# Patient Record
Sex: Female | Born: 1995 | Race: White | Hispanic: Yes | Marital: Single | Smoking: Never smoker
Health system: Southern US, Community
[De-identification: ages and names within clinical notes are randomized; demographics above are authoritative.]

---

## 2019-11-21 ENCOUNTER — Emergency Department (HOSPITAL_BASED_OUTPATIENT_CLINIC_OR_DEPARTMENT_OTHER)
Admission: EM | Admit: 2019-11-21 | Discharge: 2019-11-21 | Disposition: A | Discharge status: Home / Self Care | Payer: Medicaid Other | Attending: Emergency Medicine | Admitting: Emergency Medicine

## 2019-11-21 ENCOUNTER — Emergency Department (HOSPITAL_BASED_OUTPATIENT_CLINIC_OR_DEPARTMENT_OTHER): Payer: Medicaid Other

## 2019-11-21 ENCOUNTER — Other Ambulatory Visit: Payer: Self-pay

## 2019-11-21 ENCOUNTER — Encounter (HOSPITAL_BASED_OUTPATIENT_CLINIC_OR_DEPARTMENT_OTHER): Payer: Self-pay | Admitting: Emergency Medicine

## 2019-11-21 DIAGNOSIS — J029 Acute pharyngitis, unspecified: Secondary | ICD-10-CM | POA: Insufficient documentation

## 2019-11-21 DIAGNOSIS — R509 Fever, unspecified: Secondary | ICD-10-CM | POA: Diagnosis present

## 2019-11-21 DIAGNOSIS — Z20822 Contact with and (suspected) exposure to covid-19: Secondary | ICD-10-CM | POA: Diagnosis not present

## 2019-11-21 DIAGNOSIS — J069 Acute upper respiratory infection, unspecified: Secondary | ICD-10-CM

## 2019-11-21 LAB — SARS CORONAVIRUS 2 (TAT 6-24 HRS): SARS Coronavirus 2: NEGATIVE

## 2019-11-21 MED ORDER — ALBUTEROL SULFATE HFA 108 (90 BASE) MCG/ACT IN AERS
2.0000 | INHALATION_SPRAY | RESPIRATORY_TRACT | 0 refills | Status: AC | PRN
Start: 2019-11-21 — End: ?

## 2019-11-21 MED ORDER — BENZONATATE 200 MG PO CAPS: 200.0000 mg | ORAL_CAPSULE | Freq: Three times a day (TID) | ORAL | 0 refills | Status: AC | PRN

## 2019-11-21 MED ORDER — IBUPROFEN 800 MG PO TABS
800.0000 mg | ORAL_TABLET | Freq: Four times a day (QID) | ORAL | 0 refills | Status: AC | PRN
Start: 2019-11-21 — End: ?

## 2019-11-21 MED ORDER — PREDNISONE 20 MG PO TABS
40.0000 mg | ORAL_TABLET | Freq: Every day | ORAL | 0 refills | Status: AC
Start: 2019-11-21 — End: ?

## 2019-11-21 NOTE — ED Triage Notes (Signed)
Pt reports cough, chills, shortness of breath, fever since Wednesday. TMAX 103 at home

## 2019-11-21 NOTE — ED Provider Notes (Signed)
[facility] EMERGENCY DEPARTMENT Provider Note   CSN: [id number] Arrival date & time: 11/21/19  2706     History No chief complaint on file.   Julie Parrish is a 24 y.o. female.  Patient reports that she has been sick for 3 days with fever, chills, malaise, cough, shortness of breath.  She has had some nausea, vomiting and diarrhea.  Highest temperature at home was 103.        History reviewed. No pertinent past medical history.  There are no problems to display for this patient.      OB History   No obstetric history on file.     History reviewed. No pertinent family history.  Social History   Tobacco Use  . Smoking status: Not on file  Substance Use Topics  . Alcohol use: Not on file  . Drug use: Not on file    Home Medications Prior to Admission medications   Not on File    Allergies    Bee venom  Review of Systems   Review of Systems  Constitutional: Positive for chills and fever.  Respiratory: Positive for cough and shortness of breath.   Gastrointestinal: Positive for diarrhea, nausea and vomiting.  All other systems reviewed and are negative.   Physical Exam Updated Vital Signs BP 120/77   Pulse (!) 103   Temp 99.1 F (37.3 C) (Oral)   Resp 18   Ht 5\' 7"  (1.702 m)   Wt 51.3 kg   LMP 11/14/2019 (Within Weeks)   SpO2 99%   BMI 17.70 kg/m   Physical Exam Vitals and nursing note reviewed.  Constitutional:      General: She is not in acute distress.    Appearance: Normal appearance. She is well-developed.  HENT:     Head: Normocephalic and atraumatic.     Right Ear: Hearing normal.     Left Ear: Hearing normal.     Nose: Nose normal.  Eyes:     Conjunctiva/sclera: Conjunctivae normal.     Pupils: Pupils are equal, round, and reactive to light.  Cardiovascular:     Rate and Rhythm: Regular rhythm.     Heart sounds: S1 normal and S2 normal. No murmur. No friction rub. No gallop.   Pulmonary:     Effort: Pulmonary  effort is normal. No respiratory distress.     Breath sounds: Normal breath sounds.  Chest:     Chest wall: No tenderness.  Abdominal:     General: Bowel sounds are normal.     Palpations: Abdomen is soft.     Tenderness: There is no abdominal tenderness. There is no guarding or rebound. Negative signs include Murphy's sign and McBurney's sign.     Hernia: No hernia is present.  Musculoskeletal:        General: Normal range of motion.     Cervical back: Normal range of motion and neck supple.  Skin:    General: Skin is warm and dry.     Findings: No rash.  Neurological:     Mental Status: She is alert and oriented to person, place, and time.     GCS: GCS eye subscore is 4. GCS verbal subscore is 5. GCS motor subscore is 6.     Cranial Nerves: No cranial nerve deficit.     Sensory: No sensory deficit.     Coordination: Coordination normal.  Psychiatric:        Speech: Speech normal.  Behavior: Behavior normal.        Thought Content: Thought content normal.     ED Results / Procedures / Treatments   Labs (all labs ordered are listed, but only abnormal results are displayed) Labs Reviewed  SARS CORONAVIRUS 2 (TAT 6-24 HRS)    EKG None  Radiology No results found.  Procedures Procedures (including critical care time)  Medications Ordered in ED Medications - No data to display  ED Course  I have reviewed the triage vital signs and the nursing notes.  Pertinent labs & imaging results that were available during my care of the patient were reviewed by me and considered in my medical decision making (see chart for details).    MDM Rules/Calculators/A&P                      Patient presents to the emergency department for evaluation of Covid symptoms.  She has been running a fever with URI and GI symptoms for the last 3 days.  She reports shortness of breath but has room air oxygen saturation of 99% with clear lung fields.  Chest x-ray is clear, no evidence of  pneumonia or pneumonitis.  Covid testing sent.  Patient appears well otherwise, will treat symptomatically.  Final Clinical Impression(s) / ED Diagnoses Final diagnoses:  Upper respiratory tract infection, unspecified type    Rx / DC Orders ED Discharge Orders    None       Saralee Bolick, Canary Brim, MD 11/21/19 0501

## 2019-12-17 ENCOUNTER — Inpatient Hospital Stay (HOSPITAL_COMMUNITY): Payer: Medicaid Other

## 2019-12-17 ENCOUNTER — Observation Stay (HOSPITAL_BASED_OUTPATIENT_CLINIC_OR_DEPARTMENT_OTHER)
Admission: AD | Admit: 2019-12-17 | Discharge: 2019-12-18 | Disposition: A | Discharge status: Home / Self Care | Payer: Medicaid Other | Attending: Obstetrics and Gynecology | Admitting: Obstetrics and Gynecology

## 2019-12-17 ENCOUNTER — Other Ambulatory Visit: Payer: Self-pay

## 2019-12-17 ENCOUNTER — Emergency Department (HOSPITAL_BASED_OUTPATIENT_CLINIC_OR_DEPARTMENT_OTHER): Payer: Medicaid Other

## 2019-12-17 ENCOUNTER — Encounter (HOSPITAL_BASED_OUTPATIENT_CLINIC_OR_DEPARTMENT_OTHER): Payer: Self-pay | Admitting: Emergency Medicine

## 2019-12-17 DIAGNOSIS — D6489 Other specified anemias: Secondary | ICD-10-CM | POA: Insufficient documentation

## 2019-12-17 DIAGNOSIS — Z20822 Contact with and (suspected) exposure to covid-19: Secondary | ICD-10-CM | POA: Diagnosis not present

## 2019-12-17 DIAGNOSIS — Z3A01 Less than 8 weeks gestation of pregnancy: Secondary | ICD-10-CM | POA: Insufficient documentation

## 2019-12-17 DIAGNOSIS — R102 Pelvic and perineal pain: Secondary | ICD-10-CM | POA: Diagnosis not present

## 2019-12-17 DIAGNOSIS — O034 Incomplete spontaneous abortion without complication: Principal | ICD-10-CM | POA: Insufficient documentation

## 2019-12-17 DIAGNOSIS — Z79899 Other long term (current) drug therapy: Secondary | ICD-10-CM | POA: Insufficient documentation

## 2019-12-17 DIAGNOSIS — O209 Hemorrhage in early pregnancy, unspecified: Secondary | ICD-10-CM | POA: Diagnosis present

## 2019-12-17 DIAGNOSIS — D649 Anemia, unspecified: Secondary | ICD-10-CM | POA: Diagnosis present

## 2019-12-17 DIAGNOSIS — O039 Complete or unspecified spontaneous abortion without complication: Secondary | ICD-10-CM

## 2019-12-17 DIAGNOSIS — O031 Delayed or excessive hemorrhage following incomplete spontaneous abortion: Secondary | ICD-10-CM | POA: Diagnosis not present

## 2019-12-17 LAB — CBC WITH DIFFERENTIAL/PLATELET
Abs Immature Granulocytes: 0.04 10*3/uL (ref 0.00–0.07)
Basophils Absolute: 0.1 10*3/uL (ref 0.0–0.1)
Basophils Relative: 1 %
Eosinophils Absolute: 0.1 10*3/uL (ref 0.0–0.5)
Eosinophils Relative: 1 %
HCT: 33.2 % — ABNORMAL LOW (ref 36.0–46.0)
Hemoglobin: 10.1 g/dL — ABNORMAL LOW (ref 12.0–15.0)
Immature Granulocytes: 0 %
Lymphocytes Relative: 24 %
Lymphs Abs: 2.5 10*3/uL (ref 0.7–4.0)
MCH: 24.4 pg — ABNORMAL LOW (ref 26.0–34.0)
MCHC: 30.4 g/dL (ref 30.0–36.0)
MCV: 80.2 fL (ref 80.0–100.0)
Monocytes Absolute: 0.8 10*3/uL (ref 0.1–1.0)
Monocytes Relative: 8 %
Neutro Abs: 7 10*3/uL (ref 1.7–7.7)
Neutrophils Relative %: 66 %
Platelets: 274 10*3/uL (ref 150–400)
RBC: 4.14 MIL/uL (ref 3.87–5.11)
RDW: 14.3 % (ref 11.5–15.5)
WBC: 10.5 10*3/uL (ref 4.0–10.5)
nRBC: 0 % (ref 0.0–0.2)

## 2019-12-17 LAB — BASIC METABOLIC PANEL
Anion gap: 10 (ref 5–15)
BUN: 8 mg/dL (ref 6–20)
CO2: 24 mmol/L (ref 22–32)
Calcium: 9.1 mg/dL (ref 8.9–10.3)
Chloride: 101 mmol/L (ref 98–111)
Creatinine, Ser: 0.62 mg/dL (ref 0.44–1.00)
GFR calc Af Amer: >60 mL/min (ref >60)
GFR calc non Af Amer: >60 mL/min (ref >60)
Glucose, Bld: 102 mg/dL — ABNORMAL HIGH (ref 70–99)
Potassium: 3.5 mmol/L (ref 3.5–5.1)
Sodium: 135 mmol/L (ref 135–145)

## 2019-12-17 LAB — PREPARE RBC (CROSSMATCH)

## 2019-12-17 LAB — CBC
HCT: 27.9 % — ABNORMAL LOW (ref 36.0–46.0)
HCT: 23.2 % — ABNORMAL LOW (ref 36.0–46.0)
Hemoglobin: 8.4 g/dL — ABNORMAL LOW (ref 12.0–15.0)
Hemoglobin: 6.9 g/dL — CL (ref 12.0–15.0)
MCH: 24.5 pg — ABNORMAL LOW (ref 26.0–34.0)
MCH: 24.5 pg — ABNORMAL LOW (ref 26.0–34.0)
MCHC: 30.1 g/dL (ref 30.0–36.0)
MCHC: 29.7 g/dL — ABNORMAL LOW (ref 30.0–36.0)
MCV: 81.3 fL (ref 80.0–100.0)
MCV: 82.3 fL (ref 80.0–100.0)
Platelets: 260 10*3/uL (ref 150–400)
Platelets: 206 10*3/uL (ref 150–400)
RBC: 3.43 MIL/uL — ABNORMAL LOW (ref 3.87–5.11)
RBC: 2.82 MIL/uL — ABNORMAL LOW (ref 3.87–5.11)
RDW: 14.2 % (ref 11.5–15.5)
RDW: 14.2 % (ref 11.5–15.5)
WBC: 10.3 10*3/uL (ref 4.0–10.5)
WBC: 10.6 10*3/uL — ABNORMAL HIGH (ref 4.0–10.5)
nRBC: 0 % (ref 0.0–0.2)
nRBC: 0 % (ref 0.0–0.2)

## 2019-12-17 LAB — ABO/RH
ABO/RH(D): O POS
ABO/RH(D): O POS

## 2019-12-17 LAB — HIV ANTIBODY (ROUTINE TESTING W REFLEX): HIV Screen 4th Generation wRfx: NONREACTIVE

## 2019-12-17 LAB — SARS CORONAVIRUS 2 BY RT PCR (HOSPITAL ORDER, PERFORMED IN ~~LOC~~ HOSPITAL LAB): SARS Coronavirus 2: NEGATIVE

## 2019-12-17 LAB — HCG, QUANTITATIVE, PREGNANCY: hCG, Beta Chain, Quant, S: 7002 m[IU]/mL — ABNORMAL HIGH (ref <5)

## 2019-12-17 MED ORDER — HYDROMORPHONE HCL 1 MG/ML IJ SOLN: 1.0000 mg | Freq: Once | INTRAMUSCULAR | Status: AC

## 2019-12-17 MED ORDER — ONDANSETRON HCL 4 MG PO TABS: 4.0000 mg | ORAL_TABLET | Freq: Four times a day (QID) | ORAL | Status: DC | PRN

## 2019-12-17 MED ORDER — TRANEXAMIC ACID 1000 MG/10ML IV SOLN: 1.5000 mg/kg/h | INTRAVENOUS | Status: DC

## 2019-12-17 MED ORDER — ONDANSETRON HCL 4 MG/2ML IJ SOLN
4.0000 mg | Freq: Once | INTRAMUSCULAR | Status: AC
  Administered 2019-12-17: 4 mg via INTRAVENOUS
  Filled 2019-12-17: qty 2

## 2019-12-17 MED ORDER — MORPHINE SULFATE (PF) 4 MG/ML IV SOLN
4.0000 mg | Freq: Once | INTRAVENOUS | Status: AC
  Administered 2019-12-17: 4 mg via INTRAVENOUS
  Filled 2019-12-17: qty 1

## 2019-12-17 MED ORDER — HYDROMORPHONE HCL 1 MG/ML IJ SOLN
INTRAMUSCULAR | Status: AC
  Administered 2019-12-17: 1 mg via INTRAVENOUS
  Filled 2019-12-17: qty 1

## 2019-12-17 MED ORDER — SODIUM CHLORIDE 0.9 % IV SOLN: INTRAVENOUS | Status: DC

## 2019-12-17 MED ORDER — SODIUM CHLORIDE 0.9% IV SOLUTION: Freq: Once | INTRAVENOUS | Status: DC

## 2019-12-17 MED ORDER — TRANEXAMIC ACID-NACL 1000-0.7 MG/100ML-% IV SOLN
1000.0000 mg | INTRAVENOUS | Status: AC
  Administered 2019-12-17: 1000 mg via INTRAVENOUS
  Filled 2019-12-17: qty 100

## 2019-12-17 MED ORDER — LACTATED RINGERS IV BOLUS
1000.0000 mL | Freq: Once | INTRAVENOUS | Status: AC
  Administered 2019-12-17: 1000 mL via INTRAVENOUS

## 2019-12-17 MED ORDER — ONDANSETRON HCL 4 MG/2ML IJ SOLN: 4.0000 mg | Freq: Four times a day (QID) | INTRAMUSCULAR | Status: DC | PRN

## 2019-12-17 MED ORDER — SODIUM CHLORIDE 0.9 % IV SOLN
INTRAVENOUS | Status: DC
  Administered 2019-12-17 – 2019-12-18 (×2): 100 mL/h via INTRAVENOUS

## 2019-12-17 MED ORDER — ACETAMINOPHEN 325 MG PO TABS
650.0000 mg | ORAL_TABLET | Freq: Once | ORAL | Status: AC
  Administered 2019-12-17: 650 mg via ORAL
  Filled 2019-12-17: qty 2

## 2019-12-17 MED ORDER — SODIUM CHLORIDE 0.9 % IV BOLUS
1000.0000 mL | Freq: Once | INTRAVENOUS | Status: AC
  Administered 2019-12-17: 1000 mL via INTRAVENOUS

## 2019-12-17 MED ORDER — HYDROMORPHONE HCL 1 MG/ML IJ SOLN: 1.0000 mg | Freq: Once | INTRAMUSCULAR | Status: DC

## 2019-12-17 NOTE — ED Provider Notes (Signed)
Patient care assumed at 0700.  Pt is a G4P3.  Had HTN with third pregnancy.   Pt approx [redacted] weeks gestation here for one week of vaginal bleeding.  Pelvic US pending.    Ultrasound concerning for incomplete miscarriage. Discussed with patient findings of studies. She states that since initial pelvic examination she has passed multiple large clots. On recheck she has large amount of blood on her pad and on the stretcher. Discussed with Dr. Earlene Plater with OB/GYN, will transfer to MAU for further evaluation. Patient is in agreement with treatment plan.   Tilden Fossa, MD 12/17/19 [phone]

## 2019-12-17 NOTE — ED Triage Notes (Addendum)
Vaginal bleeding with clots x1 week. Approx [redacted] weeks pregnant. G4P3. Headache.

## 2019-12-17 NOTE — H&P (Signed)
History and Physical    Chief Complaint: Vaginal Bleeding      SUBJECTIVE HPI: Julie Parrish is a 23 y.o. G1P0 at [redacted]w[redacted]d by LMP admitted for observation for symptomatic anemia following blood loss.  She presented to MAU with incomplete miscarriage with heavy bleeding on arrival, resolved after POCs removed during pelvic exam by Dr Vergie Living in MAU. EBL in MAU 1181.  Pt arrived via EMS from [facility] without support person and reports she does not have anyone to call to be with her.  HPI  No past medical history on file.  Social History   Socioeconomic History  . Marital status: Single    Spouse name: Not on file  . Number of children: Not on file  . Years of education: Not on file  . Highest education level: Not on file  Occupational History  . Not on file  Tobacco Use  . Smoking status: Not on file  Substance and Sexual Activity  . Alcohol use: Not on file  . Drug use: Not on file  . Sexual activity: Not on file  Other Topics Concern  . Not on file  Social History Narrative  . Not on file   Social Determinants of Health   Financial Resource Strain:   . Difficulty of Paying Living Expenses:   Food Insecurity:   . Worried About Programme researcher, broadcasting/film/video in the Last Year:   . Barista in the Last Year:   Transportation Needs:   . Freight forwarder (Medical):   Marland Kitchen Lack of Transportation (Non-Medical):   Physical Activity:   . Days of Exercise per Week:   . Minutes of Exercise per Session:   Stress:   . Feeling of Stress :   Social Connections:   . Frequency of Communication with Friends and Family:   . Frequency of Social Gatherings with Friends and Family:   . Attends Religious Services:   . Active Member of Clubs or Organizations:   . Attends Banker Meetings:   Marland Kitchen Marital Status:   Intimate Partner Violence:   . Fear of Current or Ex-Partner:   . Emotionally Abused:   Marland Kitchen Physically Abused:   . Sexually Abused:    No current  facility-administered medications on file prior to encounter.   Current Outpatient Medications on File Prior to Encounter  Medication Sig Dispense Refill  . albuterol (VENTOLIN HFA) 108 (90 Base) MCG/ACT inhaler Inhale 2 puffs into the lungs every 2 (two) hours as needed for wheezing or shortness of breath (cough). 18 g 0  . benzonatate (TESSALON) 200 MG capsule Take 1 capsule (200 mg total) by mouth 3 (three) times daily as needed for cough. 20 capsule 0  . ibuprofen (ADVIL) 800 MG tablet Take 1 tablet (800 mg total) by mouth every 6 (six) hours as needed for moderate pain. 20 tablet 0  . predniSONE (DELTASONE) 20 MG tablet Take 2 tablets (40 mg total) by mouth daily with breakfast. 10 tablet 0   Allergies  Allergen Reactions  . Bee Venom Rash and Swelling    Has epi pen     ROS:  Review of Systems  Constitutional: Negative for chills, fatigue and fever.  Respiratory: Negative for shortness of breath.   Cardiovascular: Negative for chest pain.  Gastrointestinal: Positive for abdominal pain and nausea. Negative for vomiting.  Genitourinary: Positive for pelvic pain and vaginal bleeding. Negative for difficulty urinating, dysuria, flank pain, vaginal discharge and vaginal pain.  Neurological:  Negative for dizziness and headaches.  Psychiatric/Behavioral: Negative.      I have reviewed patient's Past Medical Hx, Surgical Hx, Family Hx, Social Hx, medications and allergies.   Physical Exam   Patient Vitals for the past 24 hrs:  BP Temp Temp src Pulse Resp SpO2 Height Weight  12/17/19 1745 (!) 105/56 -- -- 73 -- -- -- --  12/17/19 1730 97/71 -- -- 82 -- -- -- --  12/17/19 1715 (!) 99/45 -- -- (!) 56 -- -- -- --  12/17/19 1700 (!) 99/53 -- -- (!) 58 -- -- -- --  12/17/19 1645 (!) 105/59 -- -- 77 -- -- -- --  12/17/19 1630 (!) 107/54 -- -- 82 -- -- -- --  12/17/19 1615 (!) 96/48 -- -- (!) 56 -- -- -- --  12/17/19 1600 (!) 100/54 -- -- 69 -- -- -- --  12/17/19 1545 (!) 98/49 -- --  62 -- -- -- --  12/17/19 1530 (!) 100/50 -- -- 60 -- -- -- --  12/17/19 1515 (!) 97/46 -- -- 65 -- -- -- --  12/17/19 1513 (!) 101/48 -- -- 65 -- -- -- --  12/17/19 1500 (!) 106/47 -- -- 66 -- -- -- --  12/17/19 1446 (!) 110/42 -- -- 69 -- -- -- --  12/17/19 1443 (!) 110/49 -- -- 76 -- -- -- --  12/17/19 1426 122/72 -- -- 91 -- -- -- --  12/17/19 1340 125/81 -- -- 93 -- -- -- --  12/17/19 1221 -- -- -- 74 -- 100 % -- --  12/17/19 1220 -- -- -- 72 -- 100 % -- --  12/17/19 1211 -- -- -- (!) 57 -- 100 % -- --  12/17/19 1207 -- -- -- (!) 53 -- 100 % -- --  12/17/19 1201 -- -- -- (!) 56 -- 100 % -- --  12/17/19 1200 100/62 -- -- (!) 56 -- 100 % -- --  12/17/19 1130 104/61 -- -- 60 -- 100 % -- --  12/17/19 1110 106/66 -- -- 76 -- 99 % -- --  12/17/19 1030 (!) 98/53 -- -- 64 -- 99 % -- --  12/17/19 1015 (!) 98/55 -- -- 65 -- 100 % -- --  12/17/19 1003 94/60 -- -- 81 -- 99 % -- --  12/17/19 0728 108/66 -- -- 74 16 99 % -- --  12/17/19 0540 96/68 -- -- 78 12 97 % -- --  12/17/19 0253 -- -- -- -- -- -- 5\' 7"  (1.702 m) 50.8 kg  12/17/19 0252 123/79 98.6 F (37 C) Oral 87 18 98 % -- --   Constitutional: Well-developed, well-nourished female in moderate distress.  HEART: normal rate, heart sounds, regular rhythm RESP: normal effort, lung sounds clear and equal bilaterally GI: Abd soft, non-tender. Pos BS x 4 MS: Extremities nontender, no edema, normal ROM Neurologic: Alert and oriented x 4.  GU: Neg CVAT.  PELVIC EXAM: Heavy vaginal bleeding, 16 fox swabs used without complete visualization of cervix, 4-5 large clots removed with ring forceps with continued bleeding and poor visualization of cervix.   LAB RESULTS Results for orders placed or performed during the hospital encounter of 12/17/19 (from the past 24 hour(s))  CBC with Differential     Status: Abnormal   Collection Time: 12/17/19  3:47 AM  Result Value Ref Range   WBC 10.5 4.0 - 10.5 K/uL   RBC 4.14 3.87 - 5.11 MIL/uL    Hemoglobin 10.1 (L) 12.0 - 15.0 g/dL  HCT 33.2 (L) 36.0 - 46.0 %   MCV 80.2 80.0 - 100.0 fL   MCH 24.4 (L) 26.0 - 34.0 pg   MCHC 30.4 30.0 - 36.0 g/dL   RDW 14.3 11.5 - 15.5 %   Platelets 274 150 - 400 K/uL   nRBC 0.0 0.0 - 0.2 %   Neutrophils Relative % 66 %   Neutro Abs 7.0 1.7 - 7.7 K/uL   Lymphocytes Relative 24 %   Lymphs Abs 2.5 0.7 - 4.0 K/uL   Monocytes Relative 8 %   Monocytes Absolute 0.8 0.1 - 1.0 K/uL   Eosinophils Relative 1 %   Eosinophils Absolute 0.1 0.0 - 0.5 K/uL   Basophils Relative 1 %   Basophils Absolute 0.1 0.0 - 0.1 K/uL   Immature Granulocytes 0 %   Abs Immature Granulocytes 0.04 0.00 - 0.07 K/uL  Basic metabolic panel     Status: Abnormal   Collection Time: 12/17/19  3:47 AM  Result Value Ref Range   Sodium 135 135 - 145 mmol/L   Potassium 3.5 3.5 - 5.1 mmol/L   Chloride 101 98 - 111 mmol/L   CO2 24 22 - 32 mmol/L   Glucose, Bld 102 (H) 70 - 99 mg/dL   BUN 8 6 - 20 mg/dL   Creatinine, Ser 0.62 0.44 - 1.00 mg/dL   Calcium 9.1 8.9 - 10.3 mg/dL   GFR calc non Af Amer >60 >60 mL/min   GFR calc Af Amer >60 >60 mL/min   Anion gap 10 5 - 15  hCG, quantitative, pregnancy     Status: Abnormal   Collection Time: 12/17/19  3:47 AM  Result Value Ref Range   hCG, Beta Chain, Quant, S 7,002 (H) <5 mIU/mL  ABO/Rh     Status: None   Collection Time: 12/17/19  3:47 AM  Result Value Ref Range   ABO/RH(D) O POS    No rh immune globuloin      NOT A RH IMMUNE GLOBULIN CANDIDATE, PT RH POSITIVE Performed at [facility], [address], [place], [place] [address]   SARS Coronavirus 2 by RT PCR (hospital order, performed in [facility] hospital lab) Nasopharyngeal Nasopharyngeal Swab     Status: None   Collection Time: 12/17/19 11:09 AM   Specimen: Nasopharyngeal Swab  Result Value Ref Range   SARS Coronavirus 2 NEGATIVE NEGATIVE  Type and screen     Status: None (Preliminary result)   Collection Time: 12/17/19  2:05 PM  Result Value Ref Range    ABO/RH(D) O POS    Antibody Screen NEG    Sample Expiration      12/20/2019,2359 Performed at [facility], [place] [address], [place], [place] [address]    Unit Number [id number]    Blood Component Type RED CELLS,LR    Unit division 00    Status of Unit ALLOCATED    Transfusion Status OK TO TRANSFUSE    Crossmatch Result Compatible   ABO/Rh     Status: None   Collection Time: 12/17/19  2:05 PM  Result Value Ref Range   ABO/RH(D)      O POS Performed at [facility] [address] [place], [place] [address]   CBC     Status: Abnormal   Collection Time: 12/17/19  2:07 PM  Result Value Ref Range   WBC 10.3 4.0 - 10.5 K/uL   RBC 3.43 (L) 3.87 - 5.11 MIL/uL   Hemoglobin 8.4 (L) 12.0 -  15.0 g/dL   HCT 04.527.9 (L) 40.936.0 - 81.146.0 %   MCV 81.3 80.0 - 100.0 fL   MCH 24.5 (L) 26.0 - 34.0 pg   MCHC 30.1 30.0 - 36.0 g/dL   RDW 91.414.2 78.211.5 - 95.615.5 %   Platelets 260 150 - 400 K/uL   nRBC 0.0 0.0 - 0.2 %  CBC     Status: Abnormal   Collection Time: 12/17/19  4:40 PM  Result Value Ref Range   WBC 10.6 (H) 4.0 - 10.5 K/uL   RBC 2.82 (L) 3.87 - 5.11 MIL/uL   Hemoglobin 6.9 (LL) 12.0 - 15.0 g/dL   HCT 21.323.2 (L) 08.636.0 - 57.846.0 %   MCV 82.3 80.0 - 100.0 fL   MCH 24.5 (L) 26.0 - 34.0 pg   MCHC 29.7 (L) 30.0 - 36.0 g/dL   RDW 46.914.2 62.911.5 - 52.815.5 %   Platelets 206 150 - 400 K/uL   nRBC 0.0 0.0 - 0.2 %  Prepare RBC (crossmatch)     Status: None   Collection Time: 12/17/19  5:44 PM  Result Value Ref Range   Order Confirmation      ORDER PROCESSED BY BLOOD BANK Performed at [facility], [address] [place], [place] [address]     --/--/O POS, Val Eagle POS Performed at [facility], [address] [place], [place] [address]  [phone](05/28 1405)  IMAGING  Bedside US at 14:00 by Dr Vergie LivingPickens after pelvic exam with removal of clots and likely POCs reveals thin endometrium, no evidence of retained POCs.  DG Chest Port 1 View   US OB LESS THAN 14 WEEKS WITH [place] TRANSVAGINAL  Result  Date: 12/17/2019 CLINICAL DATA:  Pregnant, 1st trimester bleeding EXAM: OBSTETRIC <14 WK ULTRASOUND TECHNIQUE: Transabdominal ultrasound was performed for evaluation of the gestation as well as the maternal uterus and adnexal regions. COMPARISON:  None. FINDINGS: Intrauterine gestational sac: Single, irregular/angular margins Yolk sac:  Not Visualized. Embryo:  Not Visualized. MSD:  35 mm   8 w   5 d Subchorionic hemorrhage:  Small subchronic hemorrhage. Maternal uterus/adnexae: Left ovary is within normal limits. Right ovary is not discretely visualized. No free fluid. IMPRESSION: Single irregular yolk sac without gestational sac or fetal pole. Findings meet definitive criteria for failed pregnancy. This follows SRU consensus guidelines: Diagnostic Criteria for Nonviable Pregnancy Early in the First Trimester. Macy Mis Engl J Med [phone];369:1443-51. Electronically Signed   By: Charline BillsSriyesh  Krishnan M.D.   On: 12/17/2019 10:20    MAU Management/MDM: Orders Placed This Encounter  Procedures  . SARS Coronavirus 2 by RT PCR (hospital order, performed in [facility] hospital lab) Nasopharyngeal Nasopharyngeal Swab  . Wet prep, genital  . US OB LESS THAN 14 WEEKS WITH OB TRANSVAGINAL  . CBC with Differential  . Basic metabolic panel  . hCG, quantitative, pregnancy  . CBC  . HIV Antibody (routine testing w rflx)  . CBC  . Diet regular Room service appropriate? Yes; Fluid consistency: Thin  . Pelvic cart  . Informed Consent Details: Physician/Practitioner Attestation; Transcribe to consent form and obtain patient signature  . ABO/Rh  . Type and screen  . ABO/Rh  . Prepare RBC (crossmatch)  . Place in observation (patient's expected length of stay will be less than 2 midnights)    Meds ordered this encounter  Medications  . morphine 4 MG/ML injection 4 mg  . morphine 4 MG/ML injection 4 mg  . ondansetron (ZOFRAN) injection 4 mg  . sodium  chloride 0.9 % bolus 1,000 mL  . HYDROmorphone (DILAUDID) injection 1  mg  . HYDROmorphone (DILAUDID) 1 MG/ML injection    Andrey Campanile, Kathrine   : cabinet override  . DISCONTD: tranexamic acid (CYKLOKAPRON) 2,500 mg in sodium chloride 0.9 % 250 mL (10 mg/mL) infusion  . tranexamic acid (CYKLOKAPRON) IVPB 1,000 mg  . HYDROmorphone (DILAUDID) injection 1 mg  . HYDROmorphone (DILAUDID) 1 MG/ML injection    Andrey Campanile, Kathrine   : cabinet override  . lactated ringers bolus 1,000 mL  . lactated ringers bolus 1,000 mL  . DISCONTD: HYDROmorphone (DILAUDID) injection 1 mg  . ondansetron (ZOFRAN) injection 4 mg  . 0.9 %  sodium chloride infusion  . 0.9 %  sodium chloride infusion (Manually program via Guardrails IV Fluids)  . acetaminophen (TYLENOL) tablet 650 mg    Dr Vergie Living called to bedside related to significant blood loss.  See separate note.  Pt stable immediately after exam with small amount of bleeding. VS remain stable 2+ hours after exam by [facility] but pt with symptomatic anemia, hgb 6.9. Consult Dr Vergie Living and pt admitted for observation and 1 unit of PRBCs.  Pt stable at time of transfer from MAU.  ASSESSMENT 1. Incomplete miscarriage   2. First trimester bleeding   3. Normochromic normocytic anemia   4. Vaginal bleeding in pregnancy, first trimester   5. Symptomatic anemia     PLAN Admit for overnight observation Given 1 unit PRBCs and recheck hemoglobin   Sharen Counter Certified Nurse-Midwife 12/17/2019  6:08 PM

## 2019-12-17 NOTE — MAU Provider Note (Signed)
GYN Note Patient placed in dorsal lithotomy and speculum placed and approximately of clot and blood removed from the vault. Cervix visualized and large amount of POCs seen at the os that was removed; as it was removed, gestational sac ruptured. I felt I removed all the POCs as the and the bleeding stopped almost immediately. She was just having some oozing so will order so Lysteda.   Bedside transabdominal u/s done and uterus seen and thin endometrial stripe and no blood flow seen on doppler.   Follow up bleeding but patient should be fine for d/c from the MAU.  Cornelia Copa MD Attending Center for [employer] (Faculty Practice) 12/17/2019 Time: [phone]

## 2019-12-17 NOTE — ED Notes (Signed)
Assisted to change mesh panties and pad again.  Continues to have heavy amount of bleeding.  Leaving with ems at this time.  Report given to MAU.

## 2019-12-17 NOTE — MAU Note (Signed)
Pt arrived via EMS transferred from [facility] for vag bleeding and suspected miscarriage. Pt very uncomfortable. Vag bleeding moderate peripad soaked though to chuck. Changed pad and large clot came out again. Called for Provider to come to bedside.

## 2019-12-17 NOTE — MAU Provider Note (Signed)
Chief Complaint: Vaginal Bleeding   First Provider Initiated Contact with Patient 12/17/19 1349      SUBJECTIVE HPI: Julie Parrish is a 24 y.o. G1P0 at [redacted]w[redacted]d by LMP who presents to maternity admissions transferred from [facility] for heavy vaginal bleeding in early pregnancy.  She presents via EMS reporting intermittent abdominal pain with heavy vaginal bleeding and large clots.  She received Morphine for pain prior to arrival but reports it has worn off and her pain is severe.  She had pregnancy confirmation in [place] but has not had ultrasound or other care in this pregnancy.    Location: lower abdomen Quality: cramping/pressure Severity: 10/10 on pain scale Duration: since this morning Timing: constant Modifying factors: Morphine, no longer helping Associated signs and symptoms: nausea, vaginal bleeding  HPI  No past medical history on file.  Social History   Socioeconomic History  . Marital status: Single    Spouse name: Not on file  . Number of children: Not on file  . Years of education: Not on file  . Highest education level: Not on file  Occupational History  . Not on file  Tobacco Use  . Smoking status: Not on file  Substance and Sexual Activity  . Alcohol use: Not on file  . Drug use: Not on file  . Sexual activity: Not on file  Other Topics Concern  . Not on file  Social History Narrative  . Not on file   Social Determinants of Health   Financial Resource Strain:   . Difficulty of Paying Living Expenses:   Food Insecurity:   . Worried About Programme researcher, broadcasting/film/video in the Last Year:   . Barista in the Last Year:   Transportation Needs:   . Freight forwarder (Medical):   Marland Kitchen Lack of Transportation (Non-Medical):   Physical Activity:   . Days of Exercise per Week:   . Minutes of Exercise per Session:   Stress:   . Feeling of Stress :   Social Connections:   . Frequency of Communication with Friends and Family:   . Frequency of Social  Gatherings with Friends and Family:   . Attends Religious Services:   . Active Member of Clubs or Organizations:   . Attends Banker Meetings:   Marland Kitchen Marital Status:   Intimate Partner Violence:   . Fear of Current or Ex-Partner:   . Emotionally Abused:   Marland Kitchen Physically Abused:   . Sexually Abused:    No current facility-administered medications on file prior to encounter.   Current Outpatient Medications on File Prior to Encounter  Medication Sig Dispense Refill  . albuterol (VENTOLIN HFA) 108 (90 Base) MCG/ACT inhaler Inhale 2 puffs into the lungs every 2 (two) hours as needed for wheezing or shortness of breath (cough). 18 g 0  . benzonatate (TESSALON) 200 MG capsule Take 1 capsule (200 mg total) by mouth 3 (three) times daily as needed for cough. 20 capsule 0  . ibuprofen (ADVIL) 800 MG tablet Take 1 tablet (800 mg total) by mouth every 6 (six) hours as needed for moderate pain. 20 tablet 0  . predniSONE (DELTASONE) 20 MG tablet Take 2 tablets (40 mg total) by mouth daily with breakfast. 10 tablet 0   Allergies  Allergen Reactions  . Bee Venom Rash and Swelling    Has epi pen     ROS:  Review of Systems  Constitutional: Negative for chills, fatigue and fever.  Respiratory: Negative for shortness  of breath.   Cardiovascular: Negative for chest pain.  Gastrointestinal: Positive for abdominal pain and nausea. Negative for vomiting.  Genitourinary: Positive for pelvic pain and vaginal bleeding. Negative for difficulty urinating, dysuria, flank pain, vaginal discharge and vaginal pain.  Neurological: Negative for dizziness and headaches.  Psychiatric/Behavioral: Negative.      I have reviewed patient's Past Medical Hx, Surgical Hx, Family Hx, Social Hx, medications and allergies.   Physical Exam   Patient Vitals for the past 24 hrs:  BP Temp Temp src Pulse Resp SpO2 Height Weight  12/17/19 1745 (!) 105/56 -- -- 73 -- -- -- --  12/17/19 1730 97/71 -- -- 82 -- -- --  --  12/17/19 1715 (!) 99/45 -- -- (!) 56 -- -- -- --  12/17/19 1700 (!) 99/53 -- -- (!) 58 -- -- -- --  12/17/19 1645 (!) 105/59 -- -- 77 -- -- -- --  12/17/19 1630 (!) 107/54 -- -- 82 -- -- -- --  12/17/19 1615 (!) 96/48 -- -- (!) 56 -- -- -- --  12/17/19 1600 (!) 100/54 -- -- 69 -- -- -- --  12/17/19 1545 (!) 98/49 -- -- 62 -- -- -- --  12/17/19 1530 (!) 100/50 -- -- 60 -- -- -- --  12/17/19 1515 (!) 97/46 -- -- 65 -- -- -- --  12/17/19 1513 (!) 101/48 -- -- 65 -- -- -- --  12/17/19 1500 (!) 106/47 -- -- 66 -- -- -- --  12/17/19 1446 (!) 110/42 -- -- 69 -- -- -- --  12/17/19 1443 (!) 110/49 -- -- 76 -- -- -- --  12/17/19 1426 122/72 -- -- 91 -- -- -- --  12/17/19 1340 125/81 -- -- 93 -- -- -- --  12/17/19 1221 -- -- -- 74 -- 100 % -- --  12/17/19 1220 -- -- -- 72 -- 100 % -- --  12/17/19 1211 -- -- -- (!) 57 -- 100 % -- --  12/17/19 1207 -- -- -- (!) 53 -- 100 % -- --  12/17/19 1201 -- -- -- (!) 56 -- 100 % -- --  12/17/19 1200 100/62 -- -- (!) 56 -- 100 % -- --  12/17/19 1130 104/61 -- -- 60 -- 100 % -- --  12/17/19 1110 106/66 -- -- 76 -- 99 % -- --  12/17/19 1030 (!) 98/53 -- -- 64 -- 99 % -- --  12/17/19 1015 (!) 98/55 -- -- 65 -- 100 % -- --  12/17/19 1003 94/60 -- -- 81 -- 99 % -- --  12/17/19 0728 108/66 -- -- 74 16 99 % -- --  12/17/19 0540 96/68 -- -- 78 12 97 % -- --  12/17/19 0253 -- -- -- -- -- -- 5\' 7"  (1.702 m) 50.8 kg  12/17/19 0252 123/79 98.6 F (37 C) Oral 87 18 98 % -- --   Constitutional: Well-developed, well-nourished female in moderate distress.  HEART: normal rate, heart sounds, regular rhythm RESP: normal effort, lung sounds clear and equal bilaterally GI: Abd soft, non-tender. Pos BS x 4 MS: Extremities nontender, no edema, normal ROM Neurologic: Alert and oriented x 4.  GU: Neg CVAT.  PELVIC EXAM: Heavy vaginal bleeding, 16 fox swabs used without complete visualization of cervix, 4-5 large clots removed with ring forceps with continued bleeding and  poor visualization of cervix.   LAB RESULTS Results for orders placed or performed during the hospital encounter of 12/17/19 (from the past 24 hour(s))  CBC with Differential  Status: Abnormal   Collection Time: 12/17/19  3:47 AM  Result Value Ref Range   WBC 10.5 4.0 - 10.5 K/uL   RBC 4.14 3.87 - 5.11 MIL/uL   Hemoglobin 10.1 (L) 12.0 - 15.0 g/dL   HCT 01.7 (L) 49.4 - 49.6 %   MCV 80.2 80.0 - 100.0 fL   MCH 24.4 (L) 26.0 - 34.0 pg   MCHC 30.4 30.0 - 36.0 g/dL   RDW 75.9 16.3 - 84.6 %   Platelets 274 150 - 400 K/uL   nRBC 0.0 0.0 - 0.2 %   Neutrophils Relative % 66 %   Neutro Abs 7.0 1.7 - 7.7 K/uL   Lymphocytes Relative 24 %   Lymphs Abs 2.5 0.7 - 4.0 K/uL   Monocytes Relative 8 %   Monocytes Absolute 0.8 0.1 - 1.0 K/uL   Eosinophils Relative 1 %   Eosinophils Absolute 0.1 0.0 - 0.5 K/uL   Basophils Relative 1 %   Basophils Absolute 0.1 0.0 - 0.1 K/uL   Immature Granulocytes 0 %   Abs Immature Granulocytes 0.04 0.00 - 0.07 K/uL  Basic metabolic panel     Status: Abnormal   Collection Time: 12/17/19  3:47 AM  Result Value Ref Range   Sodium 135 135 - 145 mmol/L   Potassium 3.5 3.5 - 5.1 mmol/L   Chloride 101 98 - 111 mmol/L   CO2 24 22 - 32 mmol/L   Glucose, Bld 102 (H) 70 - 99 mg/dL   BUN 8 6 - 20 mg/dL   Creatinine, Ser 6.59 0.44 - 1.00 mg/dL   Calcium 9.1 8.9 - 93.5 mg/dL   GFR calc non Af Amer >60 >60 mL/min   GFR calc Af Amer >60 >60 mL/min   Anion gap 10 5 - 15  hCG, quantitative, pregnancy     Status: Abnormal   Collection Time: 12/17/19  3:47 AM  Result Value Ref Range   hCG, Beta Chain, Quant, S 7,002 (H) <5 mIU/mL  ABO/Rh     Status: None   Collection Time: 12/17/19  3:47 AM  Result Value Ref Range   ABO/RH(D) O POS    No rh immune globuloin      NOT A RH IMMUNE GLOBULIN CANDIDATE, PT RH POSITIVE Performed at [facility], [address] [place], [place] [address]   SARS Coronavirus 2 by RT PCR (hospital order, performed in [facility]  hospital lab) Nasopharyngeal Nasopharyngeal Swab     Status: None   Collection Time: 12/17/19 11:09 AM   Specimen: Nasopharyngeal Swab  Result Value Ref Range   SARS Coronavirus 2 NEGATIVE NEGATIVE  Type and screen     Status: None (Preliminary result)   Collection Time: 12/17/19  2:05 PM  Result Value Ref Range   ABO/RH(D) O POS    Antibody Screen NEG    Sample Expiration      12/20/2019,2359 Performed at [facility], [address], [place], [place] [address]    Unit Number [id number]    Blood Component Type RED CELLS,LR    Unit division 00    Status of Unit ALLOCATED    Transfusion Status OK TO TRANSFUSE    Crossmatch Result Compatible   ABO/Rh     Status: None   Collection Time: 12/17/19  2:05 PM  Result Value Ref Range   ABO/RH(D)      O POS Performed at [facility], [address], [place], [place] [address]   CBC  Status: Abnormal   Collection Time: 12/17/19  2:07 PM  Result Value Ref Range   WBC 10.3 4.0 - 10.5 K/uL   RBC 3.43 (L) 3.87 - 5.11 MIL/uL   Hemoglobin 8.4 (L) 12.0 - 15.0 g/dL   HCT 27.9 (L) 36.0 - 46.0 %   MCV 81.3 80.0 - 100.0 fL   MCH 24.5 (L) 26.0 - 34.0 pg   MCHC 30.1 30.0 - 36.0 g/dL   RDW 14.2 11.5 - 15.5 %   Platelets 260 150 - 400 K/uL   nRBC 0.0 0.0 - 0.2 %  CBC     Status: Abnormal   Collection Time: 12/17/19  4:40 PM  Result Value Ref Range   WBC 10.6 (H) 4.0 - 10.5 K/uL   RBC 2.82 (L) 3.87 - 5.11 MIL/uL   Hemoglobin 6.9 (LL) 12.0 - 15.0 g/dL   HCT 23.2 (L) 36.0 - 46.0 %   MCV 82.3 80.0 - 100.0 fL   MCH 24.5 (L) 26.0 - 34.0 pg   MCHC 29.7 (L) 30.0 - 36.0 g/dL   RDW 14.2 11.5 - 15.5 %   Platelets 206 150 - 400 K/uL   nRBC 0.0 0.0 - 0.2 %  Prepare RBC (crossmatch)     Status: None   Collection Time: 12/17/19  5:44 PM  Result Value Ref Range   Order Confirmation      ORDER PROCESSED BY BLOOD BANK Performed at [facility], [address], [place], [place] [address]     --/--/O POS, Jenetta Downer POS Performed at  [facility], [place] [address] [place], [place] [address]  [phone])  IMAGING  Bedside US at 14:00 by Dr Ilda Basset after pelvic exam with removal of clots and likely POCs reveals thin endometrium, no evidence of retained POCs.  DG Chest Port 1 View  Result Date: 11/21/2019 CLINICAL DATA:  Fever and cough EXAM: PORTABLE CHEST 1 VIEW COMPARISON:  None. FINDINGS: The heart size and mediastinal contours are within normal limits. Both lungs are clear. The visualized skeletal structures are unremarkable. IMPRESSION: No active disease. Electronically Signed   By: Ulyses Jarred M.D.   On: 11/21/2019 05:31   US OB LESS THAN 14 WEEKS WITH OB TRANSVAGINAL  Result Date: 12/17/2019 CLINICAL DATA:  Pregnant, 1st trimester bleeding EXAM: OBSTETRIC <14 WK ULTRASOUND TECHNIQUE: Transabdominal ultrasound was performed for evaluation of the gestation as well as the maternal uterus and adnexal regions. COMPARISON:  None. FINDINGS: Intrauterine gestational sac: Single, irregular/angular margins Yolk sac:  Not Visualized. Embryo:  Not Visualized. MSD:  35 mm   8 w   5 d Subchorionic hemorrhage:  Small subchronic hemorrhage. Maternal uterus/adnexae: Left ovary is within normal limits. Right ovary is not discretely visualized. No free fluid. IMPRESSION: Single irregular yolk sac without gestational sac or fetal pole. Findings meet definitive criteria for failed pregnancy. This follows SRU consensus guidelines: Diagnostic Criteria for Nonviable Pregnancy Early in the First Trimester. Alison Stalling J Med [phone]. Electronically Signed   By: Julian Hy M.D.   On: 12/17/2019 10:20    MAU Management/MDM: Orders Placed This Encounter  Procedures  . SARS Coronavirus 2 by RT PCR (hospital order, performed in [facility] hospital lab) Nasopharyngeal Nasopharyngeal Swab  . Wet prep, genital  . US OB LESS THAN 14 WEEKS WITH OB TRANSVAGINAL  . CBC with Differential  . Basic metabolic panel  . hCG, quantitative,  pregnancy  . CBC  . HIV Antibody (routine testing w rflx)  . CBC  .  Diet regular Room service appropriate? Yes; Fluid consistency: Thin  . Pelvic cart  . Informed Consent Details: Physician/Practitioner Attestation; Transcribe to consent form and obtain patient signature  . ABO/Rh  . Type and screen  . ABO/Rh  . Prepare RBC (crossmatch)  . Place in observation (patient's expected length of stay will be less than 2 midnights)    Meds ordered this encounter  Medications  . morphine 4 MG/ML injection 4 mg  . morphine 4 MG/ML injection 4 mg  . ondansetron (ZOFRAN) injection 4 mg  . sodium chloride 0.9 % bolus 1,000 mL  . HYDROmorphone (DILAUDID) injection 1 mg  . HYDROmorphone (DILAUDID) 1 MG/ML injection    Andrey Campanile, Kathrine   : cabinet override  . DISCONTD: tranexamic acid (CYKLOKAPRON) 2,500 mg in sodium chloride 0.9 % 250 mL (10 mg/mL) infusion  . tranexamic acid (CYKLOKAPRON) IVPB 1,000 mg  . HYDROmorphone (DILAUDID) injection 1 mg  . HYDROmorphone (DILAUDID) 1 MG/ML injection    Andrey Campanile, Kathrine   : cabinet override  . lactated ringers bolus 1,000 mL  . lactated ringers bolus 1,000 mL  . DISCONTD: HYDROmorphone (DILAUDID) injection 1 mg  . ondansetron (ZOFRAN) injection 4 mg  . 0.9 %  sodium chloride infusion  . 0.9 %  sodium chloride infusion (Manually program via Guardrails IV Fluids)  . acetaminophen (TYLENOL) tablet 650 mg    Dr Vergie Living called to bedside related to significant blood loss.  See separate note.  Pt stable immediately after exam with small amount of bleeding. VS remain stable 2+ hours after exam by [facility] but pt with symptomatic anemia, hgb 6.9. Consult Dr Vergie Living and pt admitted for observation and 1 unit of PRBCs.  Pt stable at time of transfer from MAU.  ASSESSMENT 1. Incomplete miscarriage   2. First trimester bleeding   3. Normochromic normocytic anemia   4. Vaginal bleeding in pregnancy, first trimester   5. Symptomatic anemia     PLAN Admit for  overnight observation Given 1 unit PRBCs and recheck hemoglobin   Sharen Counter Certified Nurse-Midwife 12/17/2019  6:08 PM

## 2019-12-17 NOTE — MAU Note (Signed)
Dr. Vergie Living at bedside for secon pelvic exam. Several more large clots removed and POC removed as well. Vag bleeding minimal now. Pain medication given.

## 2019-12-17 NOTE — ED Provider Notes (Signed)
[facility] EMERGENCY DEPARTMENT Provider Note   CSN: [id number] Arrival date & time: 12/17/19  0240   History Chief Complaint  Patient presents with  . Vaginal Bleeding    Julie Parrish is a 24 y.o. female.  The history is provided by the patient.  Vaginal Bleeding She is pregnant and has vaginal bleeding and cramping for the last 5 days.  She has been using a tampon every 2 hours and has been passing clots.  Pain is moderately severe and she rates it at 8/10.  She did have an ultrasound about a month ago.  She is gravida 4, para 3.  No past medical history on file.  There are no problems to display for this patient.   ** The histories are not reviewed yet. Please review them in the "History" navigator section and refresh this [place].   OB History    Gravida  1   Para      Term      Preterm      AB      Living        SAB      TAB      Ectopic      Multiple      Live Births              No family history on file.  Social History   Tobacco Use  . Smoking status: Not on file  Substance Use Topics  . Alcohol use: Not on file  . Drug use: Not on file    Home Medications Prior to Admission medications   Medication Sig Start Date End Date Taking? Authorizing Provider  albuterol (VENTOLIN HFA) 108 (90 Base) MCG/ACT inhaler Inhale 2 puffs into the lungs every 2 (two) hours as needed for wheezing or shortness of breath (cough). 11/21/19   Orpah Greek, MD  benzonatate (TESSALON) 200 MG capsule Take 1 capsule (200 mg total) by mouth 3 (three) times daily as needed for cough. 11/21/19   Orpah Greek, MD  ibuprofen (ADVIL) 800 MG tablet Take 1 tablet (800 mg total) by mouth every 6 (six) hours as needed for moderate pain. 11/21/19   Orpah Greek, MD  predniSONE (DELTASONE) 20 MG tablet Take 2 tablets (40 mg total) by mouth daily with breakfast. 11/21/19   Pollina, Gwenyth Allegra, MD    Allergies    Bee  venom  Review of Systems   Review of Systems  Genitourinary: Positive for vaginal bleeding.  All other systems reviewed and are negative.   Physical Exam Updated Vital Signs BP 123/79 (BP Location: Right Arm)   Pulse 87   Temp 98.6 F (37 C) (Oral)   Resp 18   Ht 5\' 7"  (1.702 m)   Wt 50.8 kg   LMP 11/14/2019 (Within Weeks)   SpO2 98%   BMI 17.54 kg/m   Physical Exam Vitals and nursing note reviewed.   24 year old female, resting comfortably and in no acute distress. Vital signs are normal. Oxygen saturation is 98%, which is normal. Head is normocephalic and atraumatic. PERRLA, EOMI. Oropharynx is clear. Neck is nontender and supple without adenopathy or JVD. Back is nontender and there is no CVA tenderness. Lungs are clear without rales, wheezes, or rhonchi. Chest is nontender. Heart has regular rate and rhythm without murmur. Abdomen is soft, flat, with mild suprapubic tenderness.  There is no rebound or guarding.  There are no masses or hepatosplenomegaly and peristalsis is normoactive.  Pelvic: Normal external female genitalia.  Small amount of blood present in the vaginal vault.  Cervix is closed, no active bleeding.  Fundus is top normal in size.  No adnexal masses or tenderness. Extremities have no cyanosis or edema, full range of motion is present. Skin is warm and dry without rash. Neurologic: Mental status is normal, cranial nerves are intact, there are no motor or sensory deficits.  ED Results / Procedures / Treatments   Labs (all labs ordered are listed, but only abnormal results are displayed) Labs Reviewed  CBC WITH DIFFERENTIAL/PLATELET - Abnormal; Notable for the following components:      Result Value   Hemoglobin 10.1 (*)    HCT 33.2 (*)    MCH 24.4 (*)    All other components within normal limits  BASIC METABOLIC PANEL - Abnormal; Notable for the following components:   Glucose, Bld 102 (*)    All other components within normal limits  HCG,  QUANTITATIVE, PREGNANCY  ABO/RH    Radiology No results found.  Procedures Procedures   Medications Ordered in ED Medications - No data to display  ED Course  I have reviewed the triage vital signs and the nursing notes.  Pertinent labs & imaging results that were available during my care of the patient were reviewed by me and considered in my medical decision making (see chart for details).  MDM Rules/Calculators/A&P First trimester pregnancy, threatened miscarriage.  Old records are reviewed, and ultrasound on April 28 showed a viable intrauterine pregnancy at 6 weeks 3 days which would put her at 11 weeks 6 days today.  Doubt heterotopic pregnancy.  hCG level will be checked and will send for repeat ultrasound.  Hemoglobin is slightly low at 10.1, but this is actually improved from last hemoglobin on record which was November 2019.  hCG is 7000 which is in the accepted range for the stage of pregnancy, but lower than would be expected for a healthy pregnancy.  Case is signed out to Dr. Madilyn Hook pending ultrasound report.  Final Clinical Impression(s) / ED Diagnoses Final diagnoses:  Threatened miscarriage  First trimester bleeding  Normochromic normocytic anemia    Rx / DC Orders ED Discharge Orders    None       Dione Booze, MD 12/17/19 0710

## 2019-12-17 NOTE — MAU Note (Signed)
L.Leftwich-kirby,CNM at bedside. Pelvic exam. Large amount af vag bleeding noted removed several large clots with vag bleeding continued(see QBL on flow sheet). IVF started and second IV line ordered and established. Pain medication given.

## 2019-12-18 DIAGNOSIS — O031 Delayed or excessive hemorrhage following incomplete spontaneous abortion: Secondary | ICD-10-CM

## 2019-12-18 DIAGNOSIS — D649 Anemia, unspecified: Secondary | ICD-10-CM

## 2019-12-18 LAB — TYPE AND SCREEN
ABO/RH(D): O POS
Antibody Screen: NEGATIVE
Unit division: 0

## 2019-12-18 LAB — BPAM RBC
Blood Product Expiration Date: 202106242359
ISSUE DATE / TIME: 202105281842
Unit Type and Rh: 5100

## 2019-12-18 LAB — CBC
HCT: 23.9 % — ABNORMAL LOW (ref 36.0–46.0)
Hemoglobin: 7.5 g/dL — ABNORMAL LOW (ref 12.0–15.0)
MCH: 26.3 pg (ref 26.0–34.0)
MCHC: 31.4 g/dL (ref 30.0–36.0)
MCV: 83.9 fL (ref 80.0–100.0)
Platelets: 168 10*3/uL (ref 150–400)
RBC: 2.85 MIL/uL — ABNORMAL LOW (ref 3.87–5.11)
RDW: 14.7 % (ref 11.5–15.5)
WBC: 7.3 10*3/uL (ref 4.0–10.5)
nRBC: 0 % (ref 0.0–0.2)

## 2019-12-18 MED ORDER — SODIUM CHLORIDE 0.9 % IV SOLN
510.0000 mg | Freq: Once | INTRAVENOUS | Status: AC
  Administered 2019-12-18: 510 mg via INTRAVENOUS
  Filled 2019-12-18: qty 17

## 2019-12-18 MED ORDER — IBUPROFEN 800 MG PO TABS
800.0000 mg | ORAL_TABLET | Freq: Three times a day (TID) | ORAL | Status: DC | PRN
  Administered 2019-12-18: 800 mg via ORAL
  Filled 2019-12-18: qty 1

## 2019-12-18 MED ORDER — SODIUM CHLORIDE 0.9 % IV SOLN: 510.0000 mg | Freq: Once | INTRAVENOUS | Status: DC

## 2019-12-19 NOTE — Discharge Summary (Addendum)
Gynecology Discharge Summary Date of Admission: 12/17/2019 Date of Discharge: 12/18/2019  The patient was admitted from the MAU for a blood transfusion due to symptomatic anemia after heavy bleeding during the course of having a miscarriage. Once POCs that were in the cervical os, the bleeding stopped. She received one unit of PRBC and a dose of IV feraheme and was fine for discharge on 5/29.  CBC Latest Ref Rng & Units 12/18/2019 12/17/2019 12/17/2019  WBC 4.0 - 10.5 K/uL 7.3 10.6(H) 10.3  Hemoglobin 12.0 - 15.0 g/dL 7.5(L) 6.9(LL) 8.4(L)  Hematocrit 36.0 - 46.0 % 23.9(L) 23.2(L) 27.9(L)  Platelets 150 - 400 K/uL 168 206 260     Patient unsure about where she wants to follow up (she lives in [place] but stays with a friend in [place]), so I sent a request to our clinic for a follow up visit at our [facility] office in 2-3 weeks.   Allergies as of 12/18/2019      Reactions   Bee Venom Rash, Swelling   Has epi pen      Medication List    TAKE these medications   albuterol 108 (90 Base) MCG/ACT inhaler Commonly known as: VENTOLIN HFA Inhale 2 puffs into the lungs every 2 (two) hours as needed for wheezing or shortness of breath (cough).   benzonatate 200 MG capsule Commonly known as: TESSALON Take 1 capsule (200 mg total) by mouth 3 (three) times daily as needed for cough.   ibuprofen 800 MG tablet Commonly known as: ADVIL Take 1 tablet (800 mg total) by mouth every 6 (six) hours as needed for moderate pain.   predniSONE 20 MG tablet Commonly known as: DELTASONE Take 2 tablets (40 mg total) by mouth daily with breakfast.       No future appointments.  Cornelia Copa MD Attending Center for [facility])

## 2019-12-21 LAB — SURGICAL PATHOLOGY

## 2019-12-29 ENCOUNTER — Telehealth: Payer: Self-pay | Admitting: Obstetrics and Gynecology

## 2019-12-29 NOTE — Telephone Encounter (Signed)
-----   Message from Bonnetta Barry sent at 12/24/2019 10:14 AM EDT ----- Regarding: RE: 2-3wk in person follow up visit Patient not returning calls.   ----- Message ----- From: [place] Bing, MD Sent: 12/19/2019   7:12 PM EDT To: [facility] Admin Subject: 2-3wk in person follow up visit

## 2021-12-22 IMAGING — DX DG CHEST 1V PORT
1 series · 1 of 1 positions shown · non-contrast
Comparison: None.

CLINICAL DATA: Fever and cough

EXAM:
PORTABLE CHEST 1 VIEW

[chest ap]
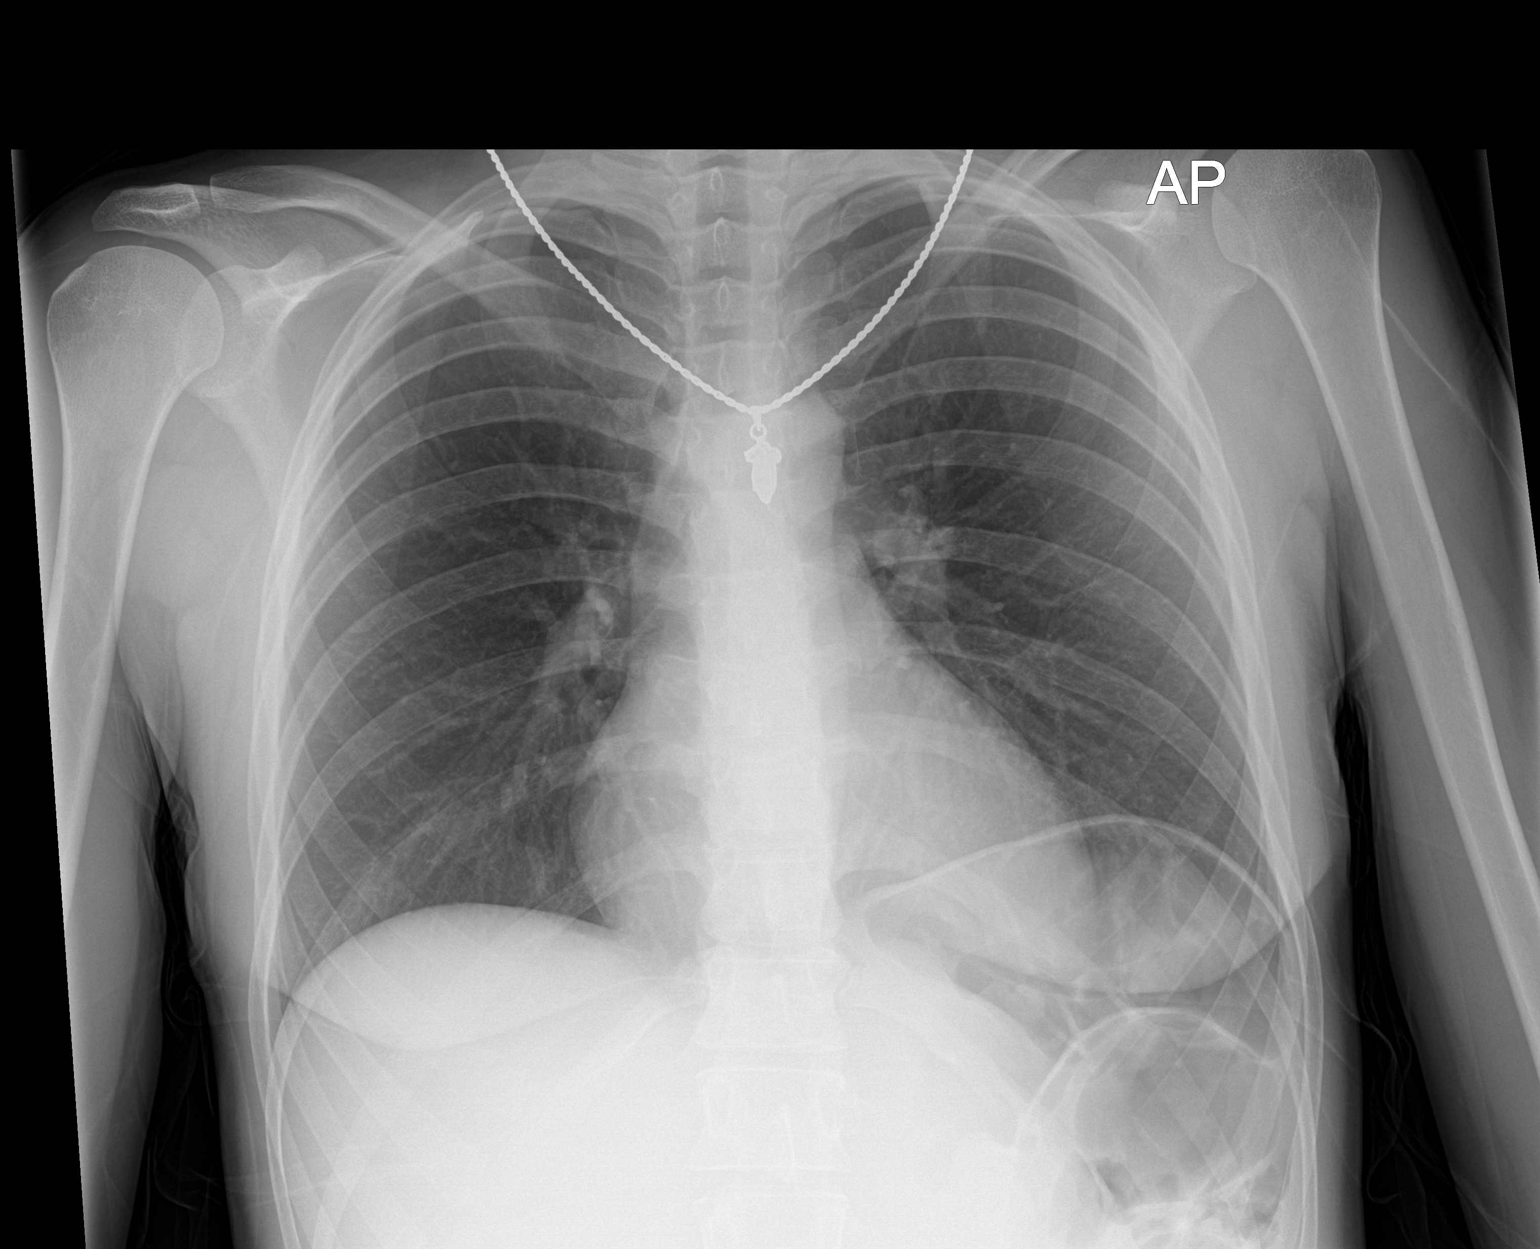

[1 of 1 positions shown; findings below may reference images not displayed]

FINDINGS: The heart size and mediastinal contours are within normal limits.
Both lungs are clear. The visualized skeletal structures are
unremarkable.
IMPRESSION: No active disease.

## 2022-01-17 IMAGING — US US OB < 14 WEEKS - US OB TV
1 series · 14 of 28 positions shown · non-contrast
Comparison: None.

CLINICAL DATA: Pregnant, 1st trimester bleeding

EXAM:
OBSTETRIC <14 WK ULTRASOUND
TECHNIQUE: Transabdominal ultrasound was performed for evaluation of the
gestation as well as the maternal uterus and adnexal regions.

[Series 1: us ob < 14 weeks - us ob tv · 14 of 94 slices shown]
[im 4/94]
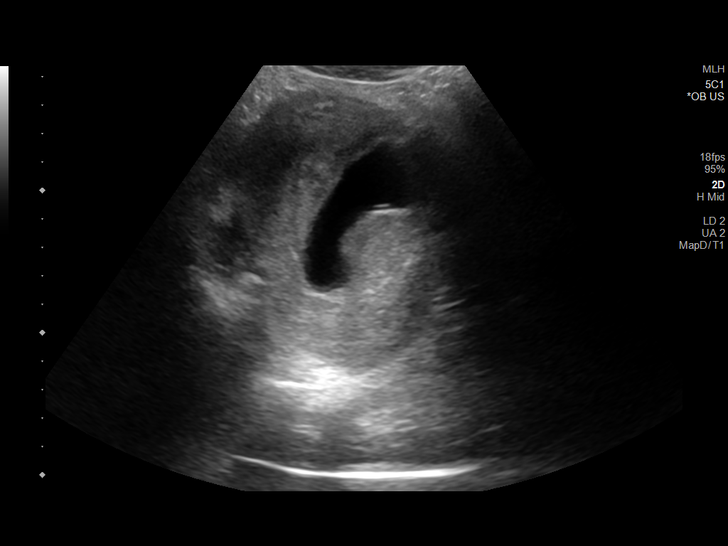
[im 11/94]
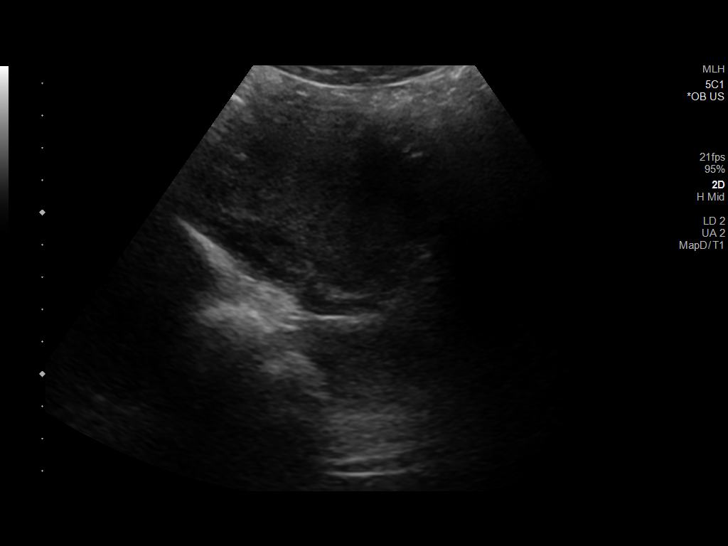
[im 18/94]
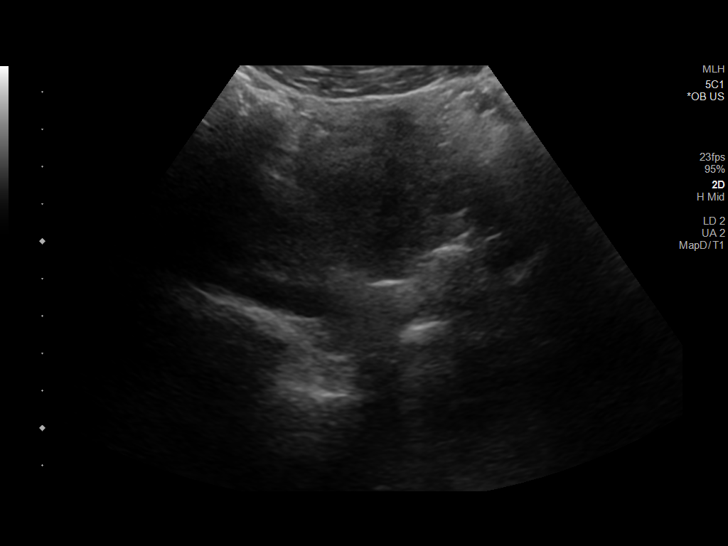
[im 25/94]
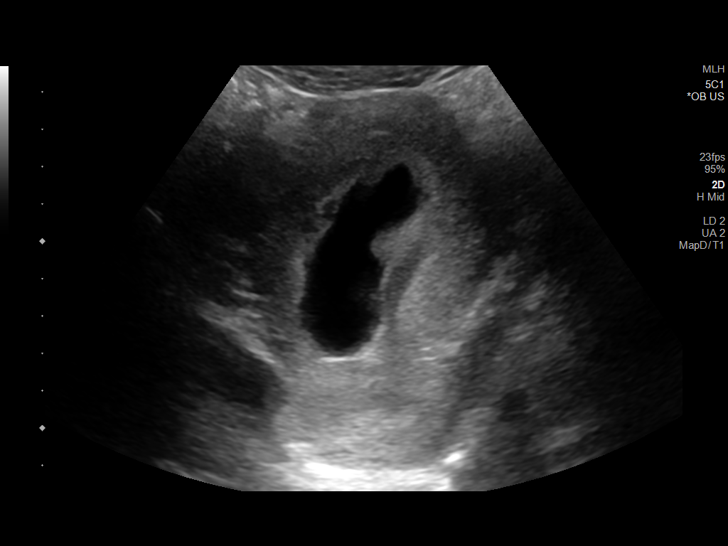
[im 32/94]
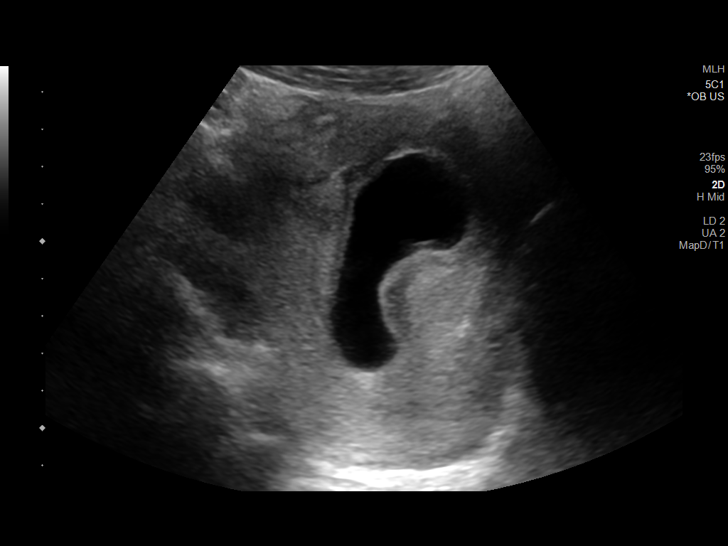
[im 38/94]
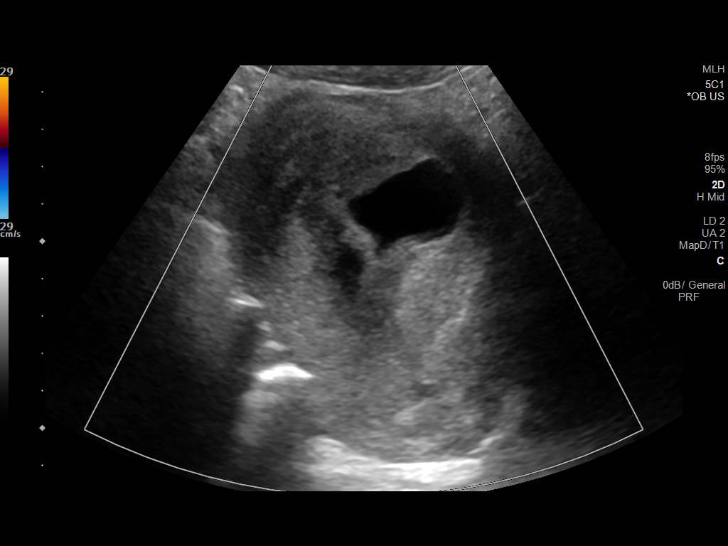
[im 45/94]
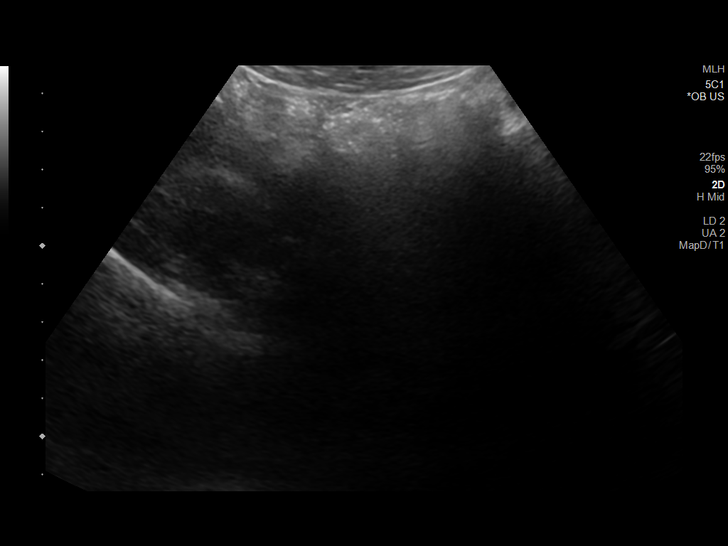
[im 52/94]
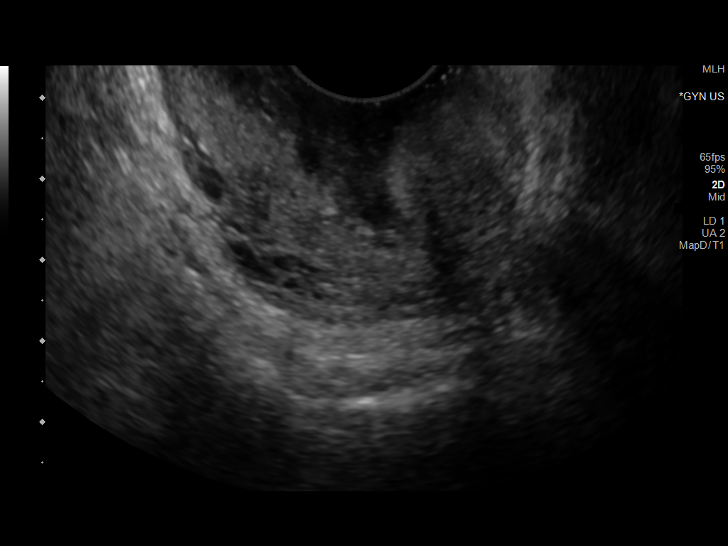
[im 59/94]
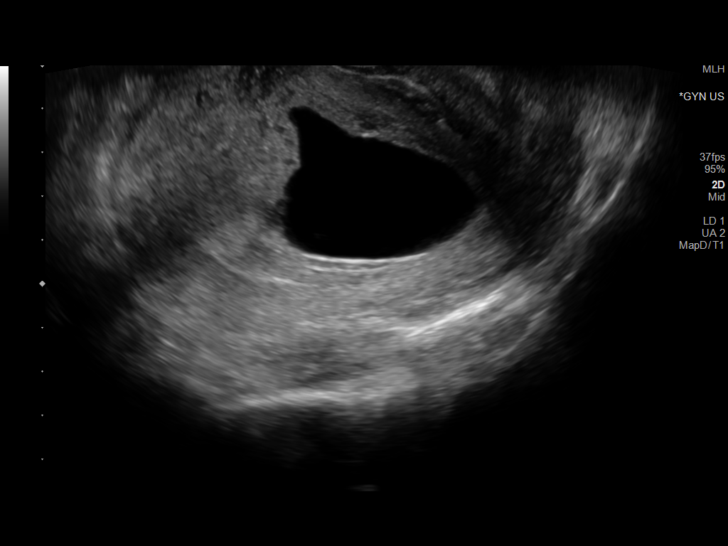
[im 66/94]
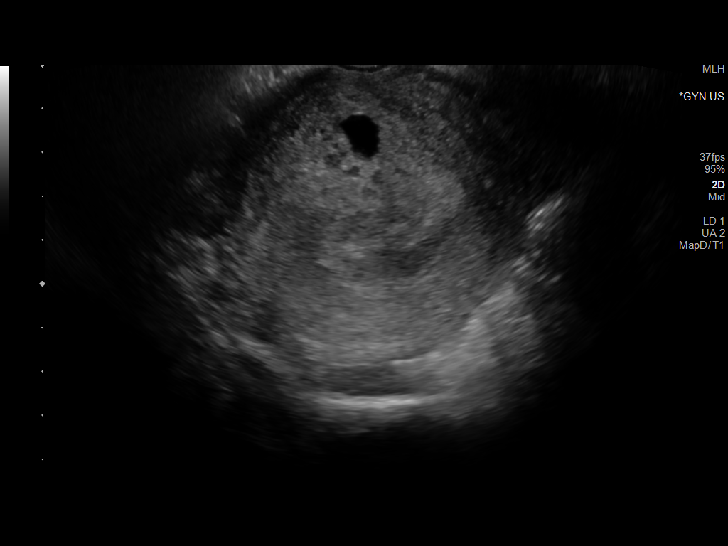
[im 73/94]
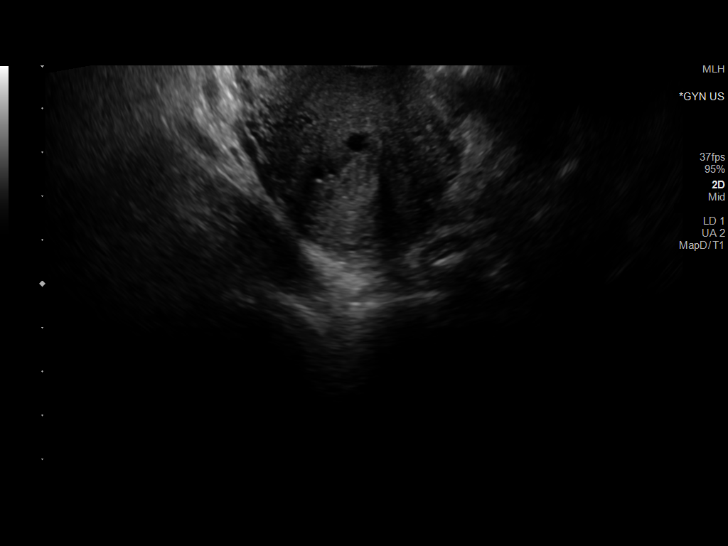
[im 80/94]
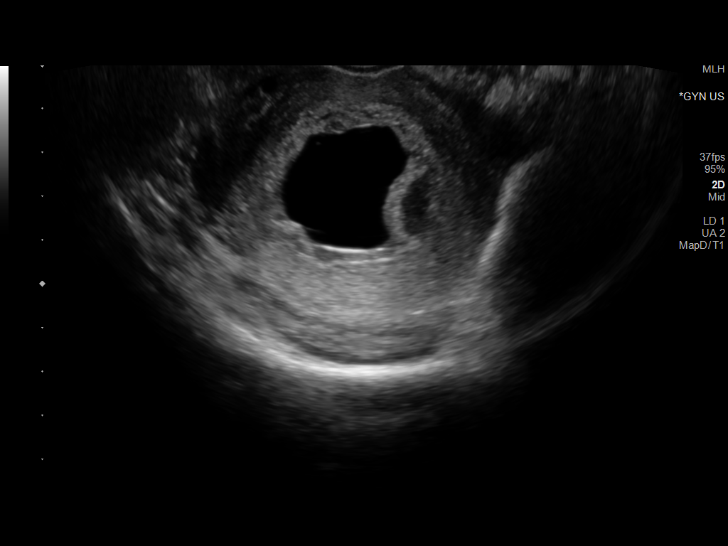
[im 87/94]
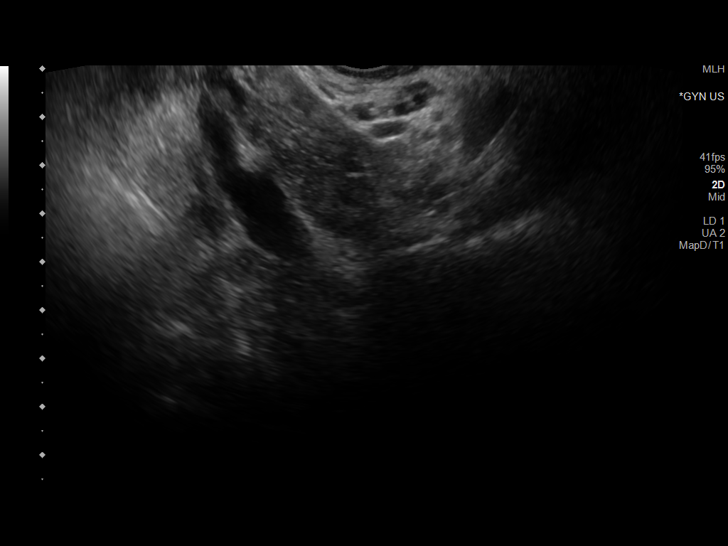
[im 94/94]
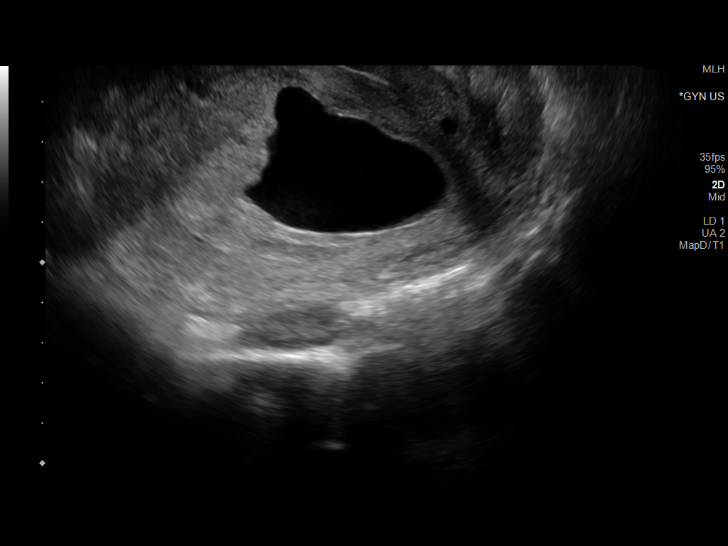

[14 of 28 positions shown; findings below may reference images not displayed]

FINDINGS: Intrauterine gestational sac: Single, irregular/angular margins

Yolk sac:  Not Visualized.

Embryo:  Not Visualized.

MSD:  35 mm   8 w   5 d

Subchorionic hemorrhage:  Small subchronic hemorrhage.

Maternal uterus/adnexae: Left ovary is within normal limits. Right
ovary is not discretely visualized.

No free fluid.
IMPRESSION: Single irregular yolk sac without gestational sac or fetal pole.

Findings meet definitive criteria for failed pregnancy. This follows
SRU consensus guidelines: Diagnostic Criteria for Nonviable
Pregnancy Early in the First Trimester. N Engl J Med
